# Patient Record
Sex: Male | Born: 2000 | Race: Black or African American | Hispanic: No | Marital: Single | Smoking: Former smoker
Health system: Southern US, Community
[De-identification: ages and names within clinical notes are randomized; demographics above are authoritative.]

## PROBLEM LIST (undated history)

## (undated) DIAGNOSIS — F909 Attention-deficit hyperactivity disorder, unspecified type: Secondary | ICD-10-CM

## (undated) HISTORY — PX: UMBILICAL HERNIA REPAIR: SHX196

## (undated) HISTORY — DX: Attention-deficit hyperactivity disorder, unspecified type: F90.9

---
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
## 2019-05-31 ENCOUNTER — Encounter: Payer: BLUE CROSS/BLUE SHIELD | Admitting: Family Medicine

## 2021-04-11 ENCOUNTER — Encounter: Payer: Self-pay | Admitting: *Deleted

## 2022-12-18 ENCOUNTER — Telehealth: Payer: BLUE CROSS/BLUE SHIELD | Admitting: Nurse Practitioner

## 2022-12-18 ENCOUNTER — Telehealth: Payer: BC Managed Care – PPO | Admitting: Nurse Practitioner

## 2022-12-18 ENCOUNTER — Telehealth: Payer: Self-pay | Admitting: Family Medicine

## 2022-12-18 DIAGNOSIS — U071 COVID-19: Secondary | ICD-10-CM

## 2022-12-18 MED ORDER — BENZONATATE 100 MG PO CAPS: 100.0000 mg | ORAL_CAPSULE | Freq: Three times a day (TID) | ORAL | 0 refills | Status: AC | PRN

## 2022-12-18 MED ORDER — MOLNUPIRAVIR EUA 200MG CAPSULE: 4.0000 | ORAL_CAPSULE | Freq: Two times a day (BID) | ORAL | 0 refills | Status: AC

## 2022-12-18 MED ORDER — ALBUTEROL SULFATE HFA 108 (90 BASE) MCG/ACT IN AERS: 2.0000 | INHALATION_SPRAY | Freq: Four times a day (QID) | RESPIRATORY_TRACT | 0 refills | Status: AC | PRN

## 2022-12-18 NOTE — Telephone Encounter (Addendum)
Caller name: JAMORIE NOGUEZ  On DPR?: Yes  Call back number: [phone] (mobile)  Provider they see: Midge Minium, MD  Reason for call:  Patient called stating he has covid and need medication. Patient has not seen Dr.Tabori since 2017. Can I schedule him a mychart visit?

## 2022-12-18 NOTE — Telephone Encounter (Signed)
Okay. I will offer him a evisit. She has two same day available tomorrow as well. I will call him

## 2022-12-18 NOTE — Telephone Encounter (Signed)
[  3:37 PM] Annye Asa i think i would encourage them to do an evisit (through [place]) b/c unless the appt is with me, he would need to re-establish in order to see someone else. [3:37 PM] Annye Asa and i don't know if i have any same days available for tomorrow

## 2022-12-18 NOTE — Progress Notes (Signed)
E-Visit  for Positive Covid Test Result   We are sorry you are not feeling well. We are here to help!  You have tested positive for COVID-19, meaning that you were infected with the novel coronavirus and could give the virus to others.  It is vitally important that you stay home so you do not spread it to others.      Isolation Instructions:   You are to isolate for 5 days from onset of your symptoms. If you must be around other household members who do not have symptoms, you need to make sure that both you and the family members are masking consistently with a high-quality mask.  After day 5 of isolation, if you have had no fever within 24 hours and you are feeling better, you can end isolation but need to mask for an additional 5 days.  After day 5 if you have a fever or are having significant symptoms, please isolate for full 10 days.  If you note any worsening of symptoms despite treatment, please seek an in-person evaluation ASAP. If you note any significant shortness of breath or any chest pain, please seek ER evaluation. Please do not delay care!   Go to the nearest hospital ED for assessment if fever/cough/breathlessness are severe or illness seems like a threat to life.    The following symptoms may appear 2-14 days after exposure: Fever Cough Shortness of breath or difficulty breathing Chills Repeated shaking with chills Muscle pain Headache Sore throat New loss of taste or smell Fatigue Congestion or runny nose Nausea or vomiting Diarrhea  You can use medication such as prescription cough medication called Tessalon Perles 100 mg. You may take 1-2 capsules every 8 hours as needed for cough and  prescription inhaler called Albuterol MDI 90 mcg /actuation 2 puffs every 4 hours as needed for shortness of breath, wheezing, cough  You may also take acetaminophen (Tylenol) as needed for fever.  HOME CARE: Only take medications as instructed by your medical team. Drink  plenty of fluids and get plenty of rest. A steam or ultrasonic humidifier can help if you have congestion.   GET HELP RIGHT AWAY IF YOU HAVE EMERGENCY WARNING SIGNS.  Call 911 or proceed to your closest emergency facility if: You develop worsening high fever. Trouble breathing Bluish lips or face Persistent pain or pressure in the chest New confusion Inability to wake or stay awake You cough up blood. Your symptoms become more severe Inability to hold down food or fluids  This list is not all possible symptoms. Contact your medical provider for any symptoms that are severe or concerning to you.   Your e-visit answers were reviewed by a board certified advanced clinical practitioner to complete your personal care plan.  Depending on the condition, your plan could have included both over the counter or prescription medications.  If there is a problem please reply once you have received a response from your provider.  Your safety is important to [place].  If you have drug allergies check your prescription carefully.    You can use MyChart to ask questions about today's visit, request a non-urgent call back, or ask for a work or school excuse for 24 hours related to this e-Visit. If it has been greater than 24 hours you will need to follow up with your provider, or enter a new e-Visit to address those concerns. You will get an e-mail in the next two days asking about your experience.  I hope  that your e-visit has been valuable and will speed your recovery. Thank you for using e-visits.  Meds ordered this encounter  Medications   benzonatate (TESSALON) 100 MG capsule    Sig: Take 1 capsule (100 mg total) by mouth 3 (three) times daily as needed.    Dispense:  30 capsule    Refill:  0   albuterol (VENTOLIN HFA) 108 (90 Base) MCG/ACT inhaler    Sig: Inhale 2 puffs into the lungs every 6 (six) hours as needed for wheezing or shortness of breath.    Dispense:  8 g    Refill:  0     I spent  approximately 5 minutes reviewing the patient's history, current symptoms and coordinating their care today.

## 2022-12-18 NOTE — Progress Notes (Signed)
Virtual Visit Consent   [name], you are scheduled for a virtual visit with a [facility] provider today. Just as with appointments in the office, your consent must be obtained to participate. Your consent will be active for this visit and any virtual visit you may have with one of our providers in the next 365 days. If you have a MyChart account, a copy of this consent can be sent to you electronically.  As this is a virtual visit, video technology does not allow for your provider to perform a traditional examination. This may limit your provider's ability to fully assess your condition. If your provider identifies any concerns that need to be evaluated in person or the need to arrange testing (such as labs, EKG, etc.), we will make arrangements to do so. Although advances in technology are sophisticated, we cannot ensure that it will always work on either your end or our end. If the connection with a video visit is poor, the visit may have to be switched to a telephone visit. With either a video or telephone visit, we are not always able to ensure that we have a secure connection.  By engaging in this virtual visit, you consent to the provision of healthcare and authorize for your insurance to be billed (if applicable) for the services provided during this visit. Depending on your insurance coverage, you may receive a charge related to this service.  I need to obtain your verbal consent now. Are you willing to proceed with your visit today? Daniel Delacruz has provided verbal consent on 12/18/2022 for a virtual visit (video or telephone). Apolonio Schneiders, FNP  Date: 12/18/2022 5:11 PM  Virtual Visit via Video Note   I, Apolonio Schneiders, connected with  Daniel Delacruz  ([id number], 2000-12-24) on 12/18/22 at  5:15 PM EST by a video-enabled telemedicine application and verified that I am speaking with the correct person using two identifiers.  Location: Patient: Virtual Visit Location Patient:  Home Provider: Virtual Visit Location Provider: Home Office   I discussed the limitations of evaluation and management by telemedicine and the availability of in person appointments. The patient expressed understanding and agreed to proceed.    History of Present Illness: Daniel Delacruz is a 22 y.o. who identifies as a male who was assigned male at birth, and is being seen today for COVID-19.  Tested positive today He has had a cough with fatigue that started yesterday   He has not had COVID in the past   He has been vaccinated for COVID  Most recent bloodwork was 2016  Problems:  Patient Active Problem List   Diagnosis Date Noted   Muscle strain of thigh 08/11/2015   Back spasm 07/05/2014   Syncope 10/13/2013   Ankle sprain and strain 06/11/2013   Breast mass in male 05/05/2013   Osgood-Schlatter's disease 08/10/2012   ADHD (attention deficit hyperactivity disorder) 08/01/2011    Allergies: No Known Allergies Medications:  Current Outpatient Medications:    albuterol (VENTOLIN HFA) 108 (90 Base) MCG/ACT inhaler, Inhale 2 puffs into the lungs every 6 (six) hours as needed for wheezing or shortness of breath., Disp: 8 g, Rfl: 0   benzonatate (TESSALON) 100 MG capsule, Take 1 capsule (100 mg total) by mouth 3 (three) times daily as needed., Disp: 30 capsule, Rfl: 0  Observations/Objective: Patient is well-developed, well-nourished in no acute distress.  Resting comfortably  at home.  Head is normocephalic, atraumatic.  No labored breathing.  Speech is  clear and coherent with logical content.  Patient is alert and oriented at baseline.    Assessment and Plan: 1. COVID-19  - molnupiravir EUA (LAGEVRIO) 200 mg CAPS capsule; Take 4 capsules (800 mg total) by mouth 2 (two) times daily for 5 days.  Dispense: 40 capsule; Refill: 0    Recommended continuing to manage symptoms with over the counter medication or Rx medications called in prior (Benzonatate for cough as  needed)  Take anti-viral with food  Discussed prevention of pregnancy for 3 months following the anti-viral treatment   Follow isolation guidelines for your place of employment   Follow Up Instructions: I discussed the assessment and treatment plan with the patient. The patient was provided an opportunity to ask questions and all were answered. The patient agreed with the plan and demonstrated an understanding of the instructions.  A copy of instructions were sent to the patient via MyChart unless otherwise noted below.    The patient was advised to call back or seek an in-person evaluation if the symptoms worsen or if the condition fails to improve as anticipated.  Time:  I spent 15 minutes with the patient via telehealth technology discussing the above problems/concerns.    Apolonio Schneiders, FNP
